# Patient Record
Sex: Male | Born: 1980 | Hispanic: Yes | Marital: Married | Smoking: Current every day smoker
Health system: Southern US, Community
[De-identification: ages and names within clinical notes are randomized; demographics above are authoritative.]

---

## 2017-02-26 ENCOUNTER — Emergency Department (HOSPITAL_COMMUNITY): Payer: PRIVATE HEALTH INSURANCE

## 2017-02-26 ENCOUNTER — Emergency Department (HOSPITAL_COMMUNITY)
Admission: EM | Admit: 2017-02-26 | Discharge: 2017-02-26 | Disposition: A | Discharge status: Home / Self Care | Payer: PRIVATE HEALTH INSURANCE | Attending: Emergency Medicine | Admitting: Emergency Medicine

## 2017-02-26 ENCOUNTER — Encounter (HOSPITAL_COMMUNITY): Payer: Self-pay | Admitting: Emergency Medicine

## 2017-02-26 DIAGNOSIS — R202 Paresthesia of skin: Secondary | ICD-10-CM | POA: Insufficient documentation

## 2017-02-26 DIAGNOSIS — W19XXXA Unspecified fall, initial encounter: Secondary | ICD-10-CM | POA: Diagnosis not present

## 2017-02-26 DIAGNOSIS — Y939 Activity, unspecified: Secondary | ICD-10-CM | POA: Diagnosis not present

## 2017-02-26 DIAGNOSIS — Y999 Unspecified external cause status: Secondary | ICD-10-CM | POA: Insufficient documentation

## 2017-02-26 DIAGNOSIS — R200 Anesthesia of skin: Secondary | ICD-10-CM

## 2017-02-26 DIAGNOSIS — R29898 Other symptoms and signs involving the musculoskeletal system: Secondary | ICD-10-CM

## 2017-02-26 DIAGNOSIS — M542 Cervicalgia: Secondary | ICD-10-CM | POA: Insufficient documentation

## 2017-02-26 DIAGNOSIS — T148XXA Other injury of unspecified body region, initial encounter: Secondary | ICD-10-CM

## 2017-02-26 DIAGNOSIS — M79601 Pain in right arm: Secondary | ICD-10-CM | POA: Diagnosis present

## 2017-02-26 DIAGNOSIS — Y929 Unspecified place or not applicable: Secondary | ICD-10-CM | POA: Diagnosis not present

## 2017-02-26 DIAGNOSIS — F17200 Nicotine dependence, unspecified, uncomplicated: Secondary | ICD-10-CM | POA: Insufficient documentation

## 2017-02-26 LAB — CBC WITH DIFFERENTIAL/PLATELET
BASOS PCT: 0 %
Basophils Absolute: 0 10*3/uL (ref 0.0–0.1)
EOS ABS: 0.1 10*3/uL (ref 0.0–0.7)
EOS PCT: 1 %
HCT: 44 % (ref 39.0–52.0)
HEMOGLOBIN: 15.3 g/dL (ref 13.0–17.0)
LYMPHS ABS: 2.8 10*3/uL (ref 0.7–4.0)
Lymphocytes Relative: 21 %
MCH: 30.5 pg (ref 26.0–34.0)
MCHC: 34.8 g/dL (ref 30.0–36.0)
MCV: 87.8 fL (ref 78.0–100.0)
MONOS PCT: 10 %
Monocytes Absolute: 1.3 10*3/uL — ABNORMAL HIGH (ref 0.1–1.0)
NEUTROS PCT: 68 %
Neutro Abs: 9.2 10*3/uL — ABNORMAL HIGH (ref 1.7–7.7)
PLATELETS: 235 10*3/uL (ref 150–400)
RBC: 5.01 MIL/uL (ref 4.22–5.81)
RDW: 12.9 % (ref 11.5–15.5)
WBC: 13.4 10*3/uL — AB (ref 4.0–10.5)

## 2017-02-26 LAB — BASIC METABOLIC PANEL
Anion gap: 15 (ref 5–15)
BUN: 22 mg/dL — AB (ref 6–20)
CALCIUM: 8.7 mg/dL — AB (ref 8.9–10.3)
CHLORIDE: 103 mmol/L (ref 101–111)
CO2: 16 mmol/L — ABNORMAL LOW (ref 22–32)
CREATININE: 1.95 mg/dL — AB (ref 0.61–1.24)
GFR calc non Af Amer: 42 mL/min — ABNORMAL LOW (ref >60)
GFR, EST AFRICAN AMERICAN: 49 mL/min — AB (ref >60)
Glucose, Bld: 99 mg/dL (ref 65–99)
Potassium: 3.8 mmol/L (ref 3.5–5.1)
SODIUM: 134 mmol/L — AB (ref 135–145)

## 2017-02-26 MED ORDER — CYCLOBENZAPRINE HCL 10 MG PO TABS: 10.0000 mg | ORAL_TABLET | Freq: Every day | ORAL | 0 refills | Status: AC

## 2017-02-26 MED ORDER — ACETAMINOPHEN 500 MG PO TABS: 1000.0000 mg | ORAL_TABLET | Freq: Three times a day (TID) | ORAL | 0 refills | Status: AC

## 2017-02-26 MED ORDER — SODIUM CHLORIDE 0.9 % IV BOLUS (SEPSIS)
1000.0000 mL | Freq: Once | INTRAVENOUS | Status: AC
Start: 2017-02-26 — End: 2017-02-26
  Administered 2017-02-26: 1000 mL via INTRAVENOUS

## 2017-02-26 MED ORDER — FENTANYL CITRATE (PF) 100 MCG/2ML IJ SOLN
50.0000 ug | Freq: Once | INTRAMUSCULAR | Status: AC
  Administered 2017-02-26: 50 ug via INTRAVENOUS
  Filled 2017-02-26: qty 2

## 2017-02-26 MED ORDER — GADOBENATE DIMEGLUMINE 529 MG/ML IV SOLN
10.0000 mL | Freq: Once | INTRAVENOUS | Status: AC
  Administered 2017-02-26: 10 mL via INTRAVENOUS

## 2017-02-26 MED ORDER — ONDANSETRON HCL 4 MG/2ML IJ SOLN
4.0000 mg | Freq: Once | INTRAMUSCULAR | Status: DC
  Filled 2017-02-26: qty 2

## 2017-02-26 NOTE — ED Notes (Signed)
Pt stable, ambulatory, states understanding of discharge instructions 

## 2017-02-26 NOTE — ED Provider Notes (Addendum)
[facility] Provider Note   CSN: [id number] Arrival date & time: 02/26/17  1038     History   Chief Complaint Chief Complaint  Patient presents with  . Fall  . Neck Pain  . Clavicle Injury  . Head Injury    HPI Edward Henderson is a 36 y.o. male.  Patient is a 36 year old male who presents with right arm pain and numbness. He states 2 days ago he was drinking heavily because he was at a wedding. That night he was trying to get back to the bed and fell several times. He doesn't really have any recollection of that but he doesn't report having some charley horses in his legs that were causing her legs are cramping him continuing to fall. He fell over onto his right side and has some pain along the right side of his neck and his right shoulder. He also since that time it's been having some numbness in his right arm down to his hand. He states the complete hand feels numb. It's also feeling weaker as he's been dropping things. He doesn't have as good of a hand grasp.  He does complain of some soreness to his right hip. He states it hurts to walk on it. He denies any pain to his back. He's had some ongoing vomiting which he attributes to the drinking and hangover.      History reviewed. No pertinent past medical history.  There are no active problems to display for this patient.   History reviewed. No pertinent surgical history.     Home Medications    Prior to Admission medications   Not on File    Family History No family history on file.  Social History Social History  Substance Use Topics  . Smoking status: Current Every Day Smoker  . Smokeless tobacco: Current User  . Alcohol use Yes     Allergies   Patient has no allergy information on record.   Review of Systems Review of Systems  Constitutional: Positive for fatigue. Negative for chills, diaphoresis and fever.  HENT: Negative for congestion, rhinorrhea and  sneezing.   Eyes: Negative.   Respiratory: Negative for cough, chest tightness and shortness of breath.   Cardiovascular: Negative for chest pain and leg swelling.  Gastrointestinal: Positive for nausea and vomiting. Negative for abdominal pain, blood in stool and diarrhea.  Genitourinary: Negative for difficulty urinating, flank pain, frequency and hematuria.  Musculoskeletal: Positive for arthralgias, myalgias and neck pain. Negative for back pain.  Skin: Positive for wound. Negative for rash.  Neurological: Positive for speech difficulty, numbness and headaches. Negative for dizziness and weakness.     Physical Exam Updated Vital Signs BP 121/77   Pulse 90   Temp 98.2 F (36.8 C) (Oral)   Resp 18   Ht 5\' 10"  (1.778 m)   Wt 106.6 kg (235 lb)   SpO2 97%   BMI 33.72 kg/m   Physical Exam  Constitutional: He is oriented to person, place, and time. He appears well-developed and well-nourished.  HENT:  Head: Normocephalic and atraumatic.  Patient has some abrasions to his right forehead and along the right side of his neck.  No bruits noted.  Eyes: Pupils are equal, round, and reactive to light.  Neck:  There some tenderness to the right cervical paraspinal area. No pain to the thoracic or lumbosacral spine. No step-offs or deformities.  Cardiovascular: Normal rate, regular rhythm and normal heart sounds.   Pulmonary/Chest:  Effort normal and breath sounds normal. No respiratory distress. He has no wheezes. He has no rales. He exhibits no tenderness.  Abdominal: Soft. Bowel sounds are normal. There is no tenderness. There is no rebound and no guarding.  Musculoskeletal: Normal range of motion. He exhibits no edema.  Patient has pain on palpation of the right shoulder and distal clavicle area. There is overlying abrasion. There is no pain to the elbow or forearm. He has some pain to the right hip as well. Pedal pulses are intact. Radial pulses are intact. He does have some numbness to  light touch throughout the whole hand and right arm in all nerve distributions. He has some weakness on grasping of the hand as well as flexion and extension at the elbow. No numbness or weakness to the lower extremities is noted.  Lymphadenopathy:    He has no cervical adenopathy.  Neurological: He is alert and oriented to person, place, and time.  Skin: Skin is warm and dry. No rash noted.  Psychiatric: He has a normal mood and affect.     ED Treatments / Results  Labs (all labs ordered are listed, but only abnormal results are displayed) Labs Reviewed  BASIC METABOLIC PANEL  CBC WITH DIFFERENTIAL/PLATELET    EKG  EKG Interpretation None       Radiology No results found.  Procedures Procedures (including critical care time)  Medications Ordered in ED Medications  sodium chloride 0.9 % bolus 1,000 mL (not administered)  ondansetron (ZOFRAN) injection 4 mg (not administered)     Initial Impression / Assessment and Plan / ED Course  I have reviewed the triage vital signs and the nursing notes.  Pertinent labs & imaging results that were available during my care of the patient were reviewed by me and considered in my medical decision making (see chart for details).     Pt presents with right arm pain and associated numbness/weakness after a fall.  He also slept on that side all night.  He could have a brachial plexus neuropathy but given the associated trauma and neck pain with some abrasion to lateral neck, will check MR of cervical spine and MRA of neck to assess for other injuries.  Given IVFs for likely dehydration and pain/nausea meds.  Dr. Eudelia Bunch to take over care.  Final Clinical Impressions(s) / ED Diagnoses   Final diagnoses:  None    New Prescriptions New Prescriptions   No medications on file     Rolan Bucco, MD 02/26/17 1636    Rolan Bucco, MD 02/26/17 1659

## 2017-02-26 NOTE — ED Provider Notes (Signed)
I assumed care of this patient from Dr. Fredderick PhenixBelfi at 1700.  Please see their note for further details of Hx, PE.  Briefly patient is a 36 y.o. male who presented with right arm numbness and weakness following injury to the right shoulder while intoxicated. .   Current plan is to follow-up MRIs to look for any cervical spine, arterial or brachial plexus injuries.  MRI with evidence of muscle strain and surrounding edema.  No obvious cervical spine or nerve impingement.  Brachial plexus looks intact.  Reexamined the patient and still having numbness and weakness in the right upper extremity.  Likely brachial plexus hyperextension versus contusion injury.  Recommended monitoring for several weeks and orthopedic/neurosurgery follow[place] if symptoms do not improve or resolve in 2-4 weeks.  The patient is safe for discharge with strict return precautions.   Disposition: Discharge  Condition: Good  I have discussed the results, Dx and Tx plan with the patient who expressed understanding and agree(s) with the plan. Discharge instructions discussed at great length. The patient was given strict return precautions who verbalized understanding of the instructions. No further questions at time of discharge.    New Prescriptions   ACETAMINOPHEN (TYLENOL) 500 MG TABLET    Take 2 tablets (1,000 mg total) by mouth every 8 (eight) hours. Do not take more than 4000 mg of acetaminophen (Tylenol) in a 24-hour period. Please note that other medicines that you may be prescribed may have Tylenol as well.   CYCLOBENZAPRINE (FLEXERIL) 10 MG TABLET    Take 1 tablet (10 mg total) by mouth at bedtime.        Nira Connardama, Klynn Linnemann Eduardo, MD 02/26/17 2125

## 2017-02-26 NOTE — ED Notes (Signed)
Patient transported to MRI 

## 2017-02-26 NOTE — Discharge Instructions (Signed)
You may use over-the-counter Motrin (Ibuprofen), Acetaminophen (Tylenol), topical muscle creams such as SalonPas, Federal-Mogulcy Hot, Bengay, etc. Please apply ice for 20 min, 3-4 times per day to the area above the right clavicle for the next 2-4 days.   If symptoms do not improve in the next 2-4 weeks, please follow-up with your primary care provider for referral to orthopedic surgery or neurosurgery for further evaluation and management.

## 2017-02-26 NOTE — ED Triage Notes (Signed)
Pt/wife stated, he was slumped over in the chair from drinking alcohol and fell c/o having charlie horses on my legs. He fell on his collar bone and his face and feels weak. Pt. Had a 6 pack and a couple of shots.

## 2017-02-26 NOTE — ED Triage Notes (Signed)
Wife stated every time he tried to get up he would fall

## 2017-11-27 IMAGING — DX DG HIP (WITH OR WITHOUT PELVIS) 2-3V*R*
3 series · 3 of 3 positions shown · non-contrast
Comparison: None.

CLINICAL DATA: Pain after trauma

EXAM:
DG HIP (WITH OR WITHOUT PELVIS) 2-3V RIGHT

[t pelvis ap]
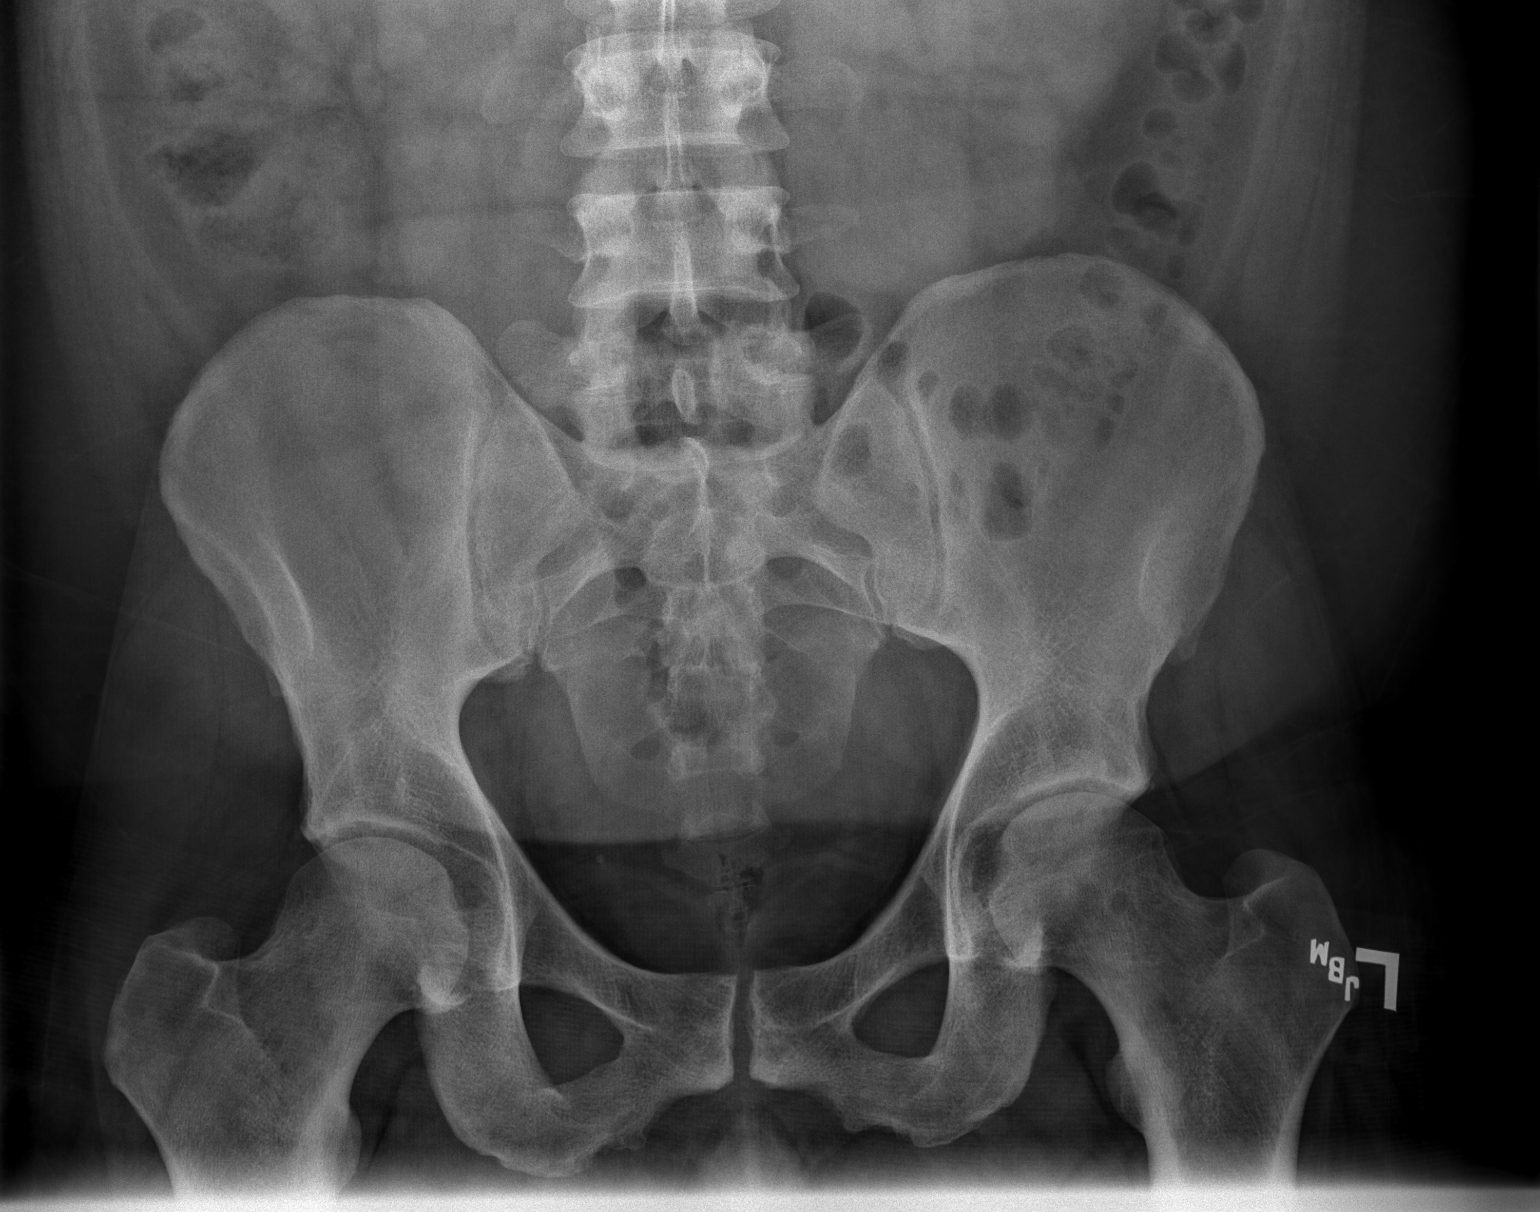

[t hip ap right]
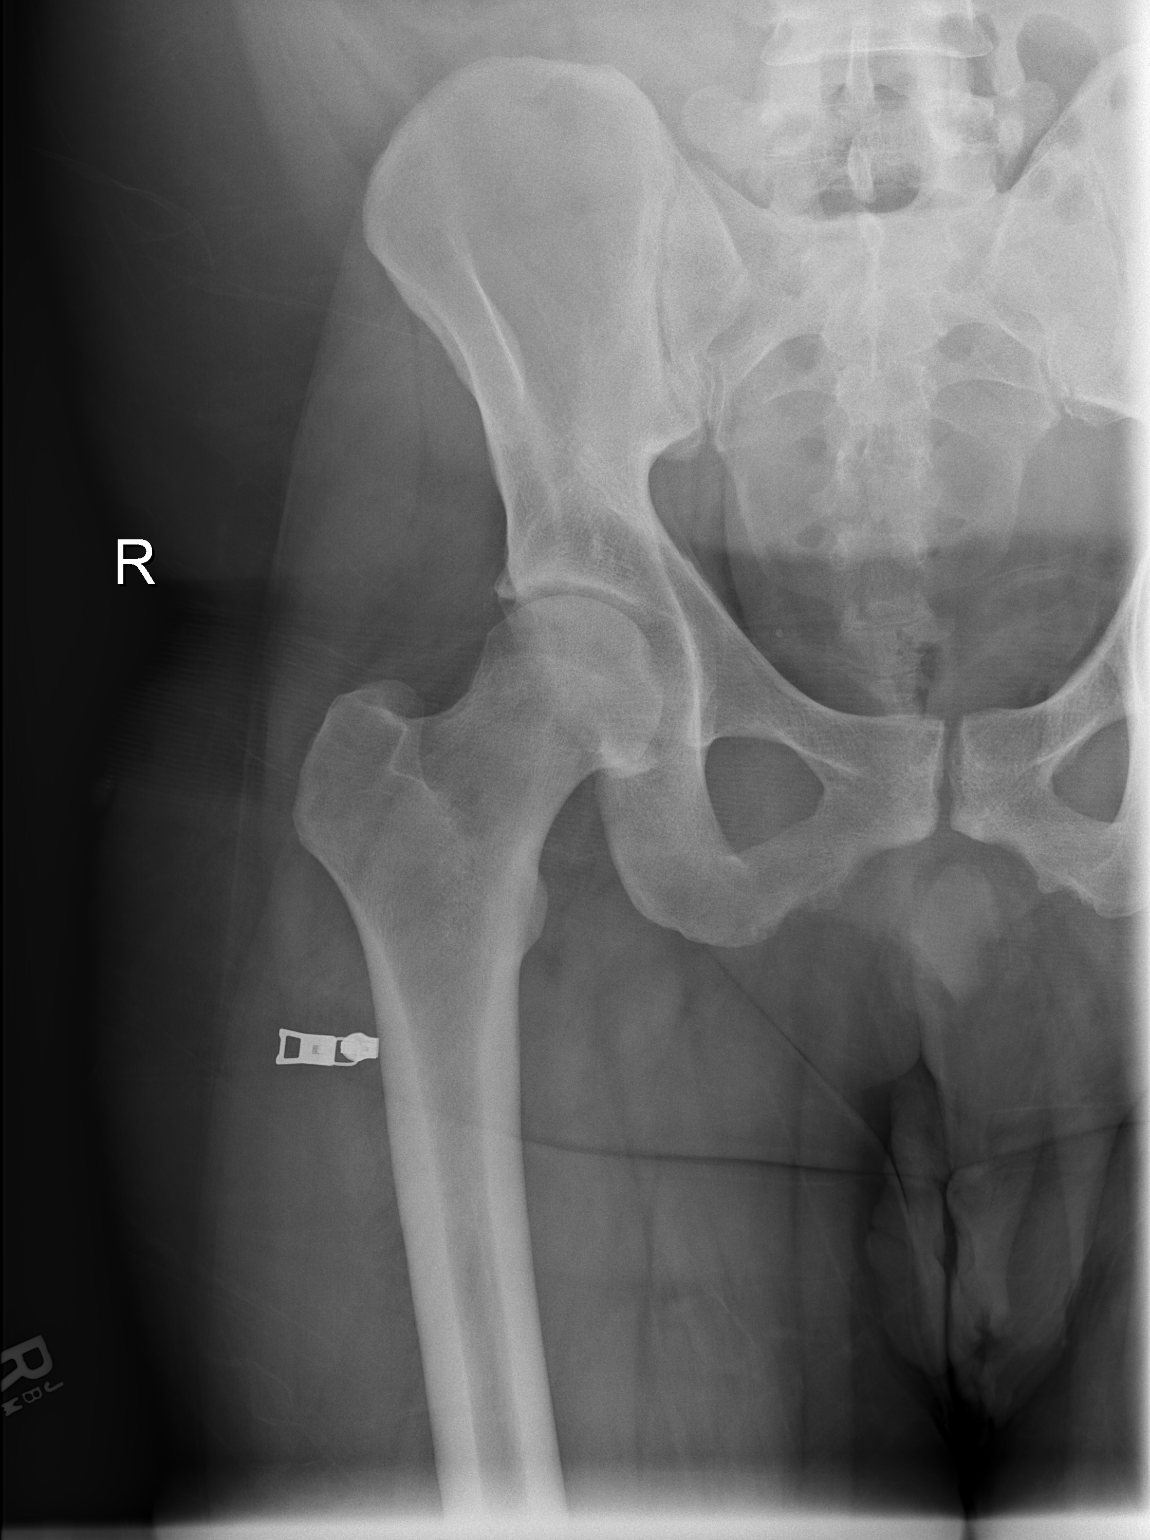

[t hip frog leg right]
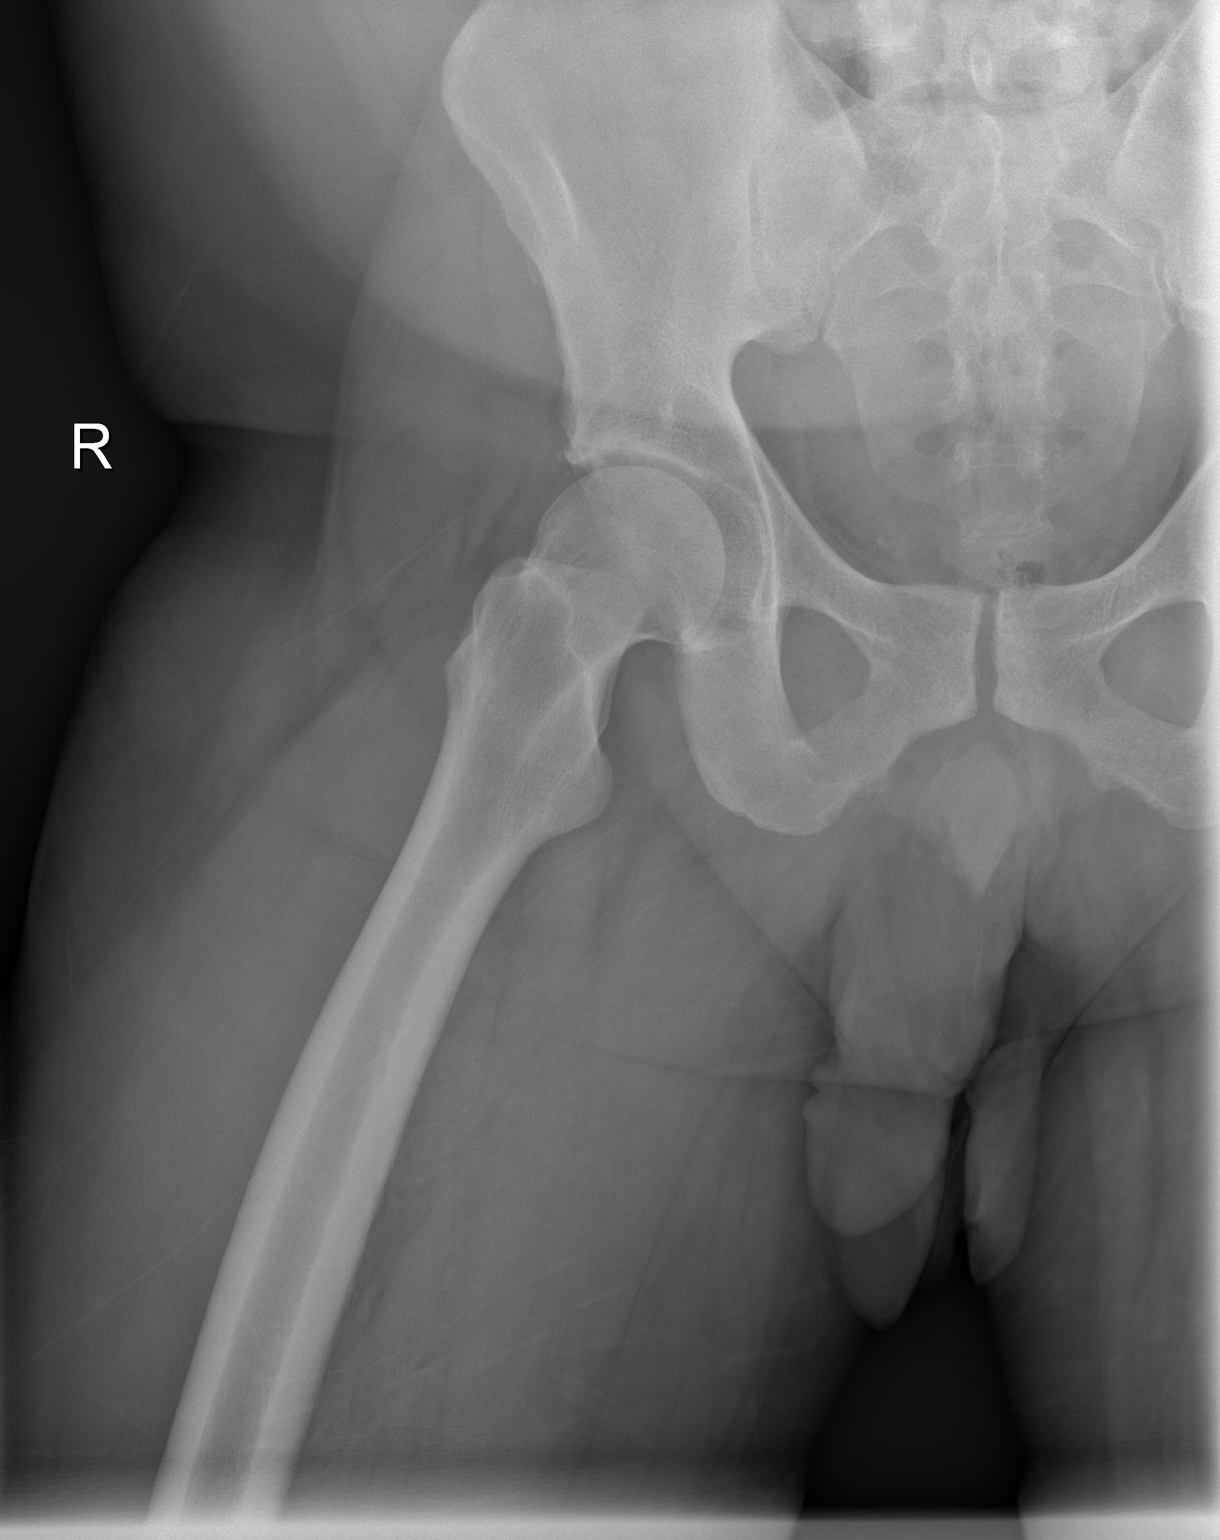

[3 of 3 positions shown; findings below may reference images not displayed]

FINDINGS: There is no evidence of hip fracture or dislocation. There is no
evidence of arthropathy or other focal bone abnormality.
IMPRESSION: Negative.
